# Patient Record
Sex: Female | Born: 1960 | Race: White | Hispanic: No | Marital: Married
Health system: Southern US, Community
[De-identification: ages and names within clinical notes are randomized; demographics above are authoritative.]

---

## 1998-10-16 ENCOUNTER — Other Ambulatory Visit: Admission: RE | Admit: 1998-10-16 | Discharge: 1998-10-16 | Payer: Self-pay | Admitting: Family Medicine

## 2001-02-02 ENCOUNTER — Other Ambulatory Visit: Admission: RE | Admit: 2001-02-02 | Discharge: 2001-02-02 | Payer: Self-pay | Admitting: Family Medicine

## 2002-02-11 ENCOUNTER — Other Ambulatory Visit: Admission: RE | Admit: 2002-02-11 | Discharge: 2002-02-11 | Payer: Self-pay | Admitting: Family Medicine

## 2002-06-13 ENCOUNTER — Ambulatory Visit (HOSPITAL_COMMUNITY): Admission: RE | Admit: 2002-06-13 | Discharge: 2002-06-13 | Payer: Self-pay | Admitting: Family Medicine

## 2002-06-13 ENCOUNTER — Encounter: Payer: Self-pay | Admitting: Family Medicine

## 2003-09-03 ENCOUNTER — Encounter: Admission: RE | Admit: 2003-09-03 | Discharge: 2003-09-03 | Payer: Self-pay | Admitting: Cardiology

## 2004-07-26 ENCOUNTER — Ambulatory Visit: Payer: Self-pay | Admitting: Family Medicine

## 2005-05-09 ENCOUNTER — Ambulatory Visit: Payer: Self-pay | Admitting: Family Medicine

## 2005-06-19 IMAGING — CT CT ANGIO ABDOMEN
2 of 6 series · 14 of 32 positions shown, 19 images · IV contrast (125 ML OMNI 300)
Comparison: none

CLINICAL DATA: 42-year-old female, benign renovascular hypertension.  
CT ANGIOGRAM OF THE ABDOMEN TO EVALUATE FOR RENAL ARTERY STENOSIS
No comparisons.
TECHNIQUE: Pre- and post-contrast imaging was performed of the abdomen without oral contrast. 125 cc of Omnipaque 350 was administered for the post-contrast axial imaging.  Coronal and sagittal reformations were obtained from the thin section data set.

[Series 4: renal/sma cta · axial · 0.70mm/px · z∈[-280,-70]mm · 7 of 278 slices shown, 12 images]
[im 35/278  soft-tissue]
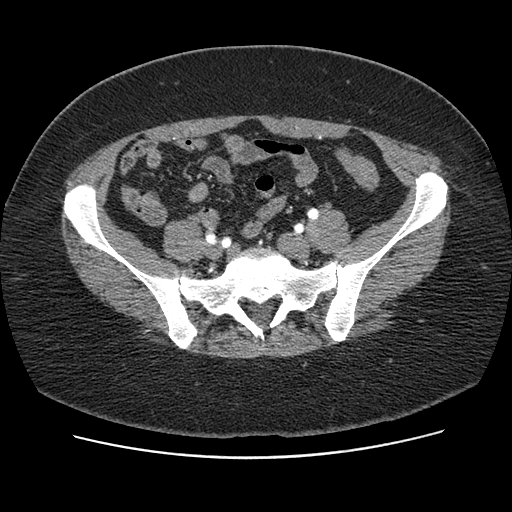
[im 35/278  bone]
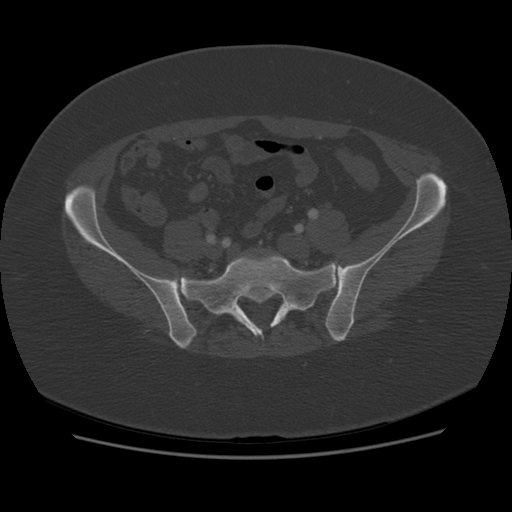
[im 70/278  soft-tissue]
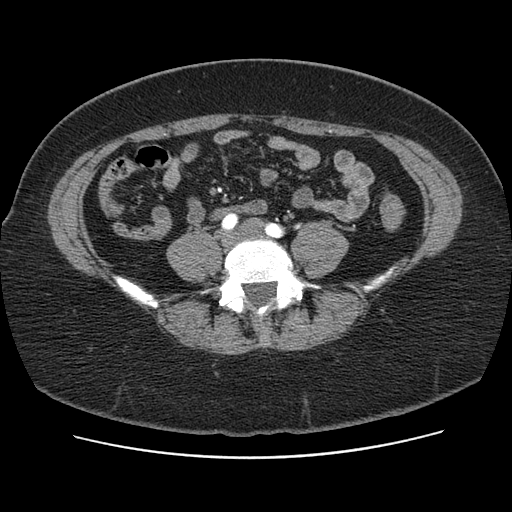
[im 104/278  soft-tissue]
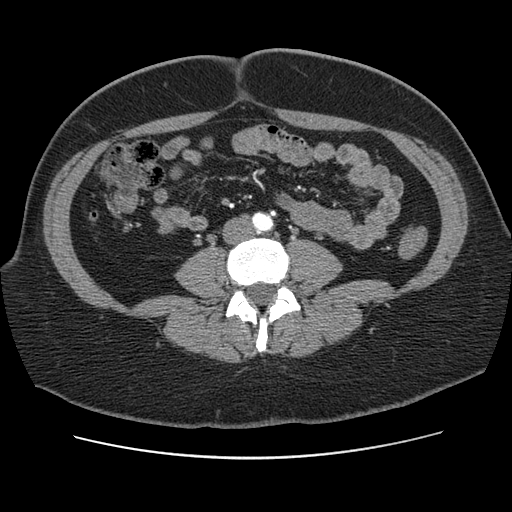
[im 139/278  soft-tissue]
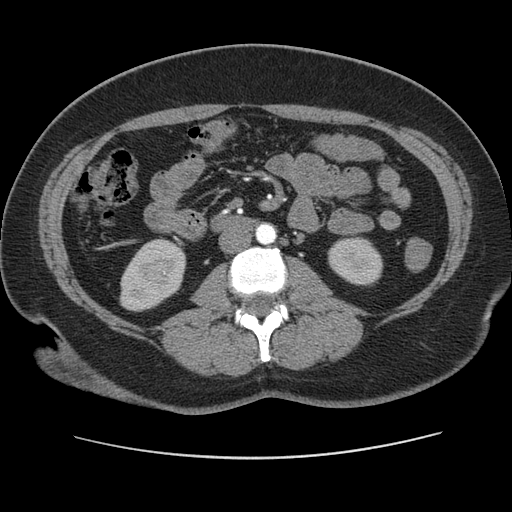
[im 139/278  lung]
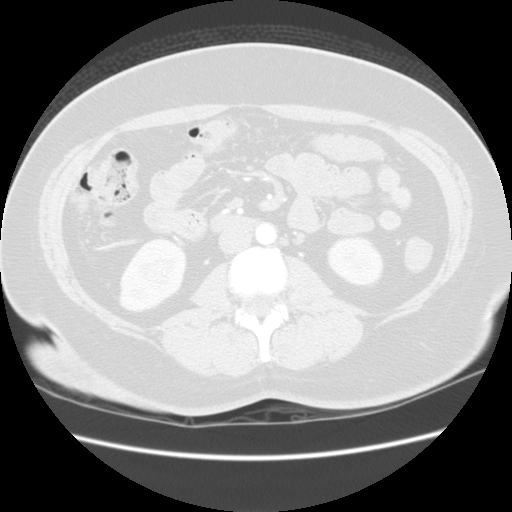
[im 174/278  soft-tissue]
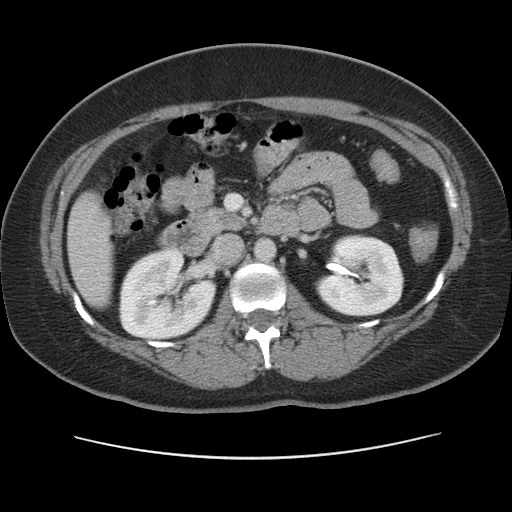
[im 174/278  lung]
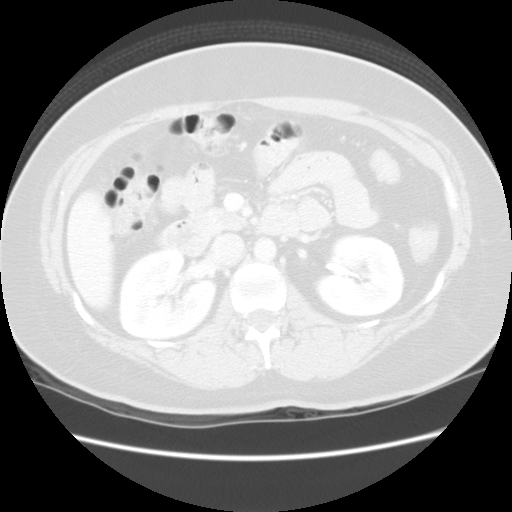
[im 208/278  soft-tissue]
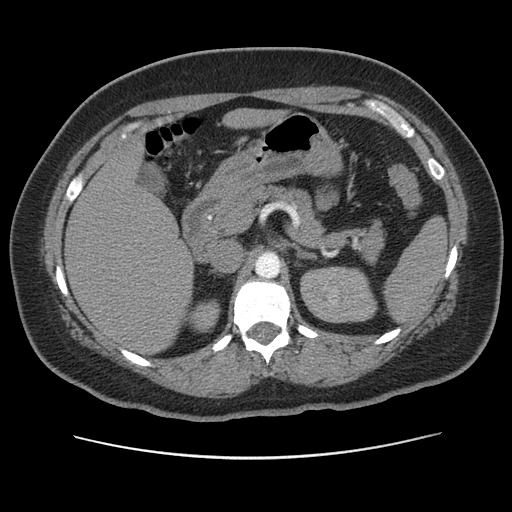
[im 208/278  lung]
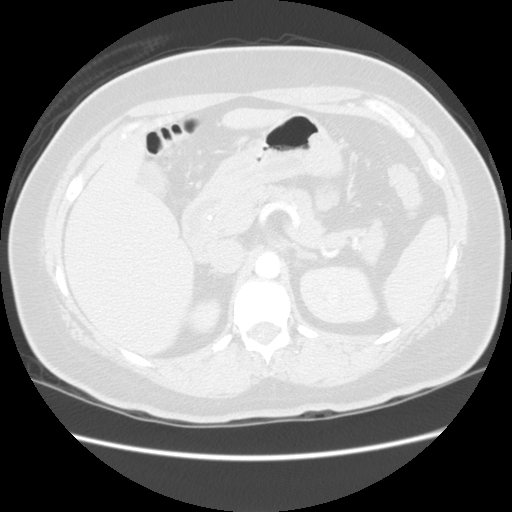
[im 243/278  soft-tissue]
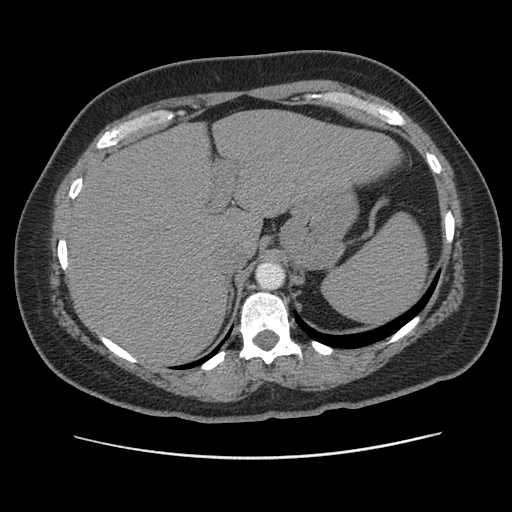
[im 243/278  lung]
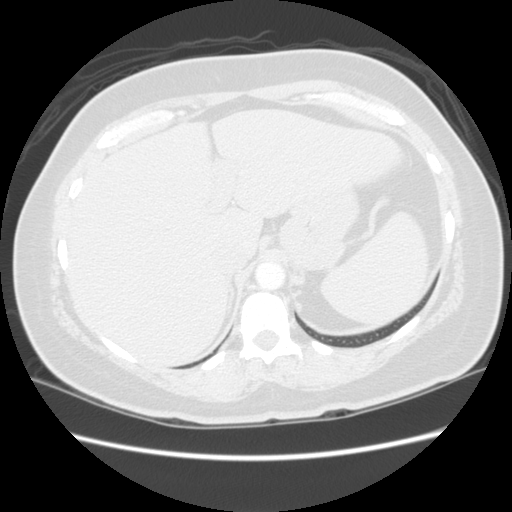

[Series 5: recon 2: renal/sma cta · axial · 0.70mm/px · z∈[-296,-102]mm · 7 of 437 slices shown]
[im 32/437  soft-tissue]
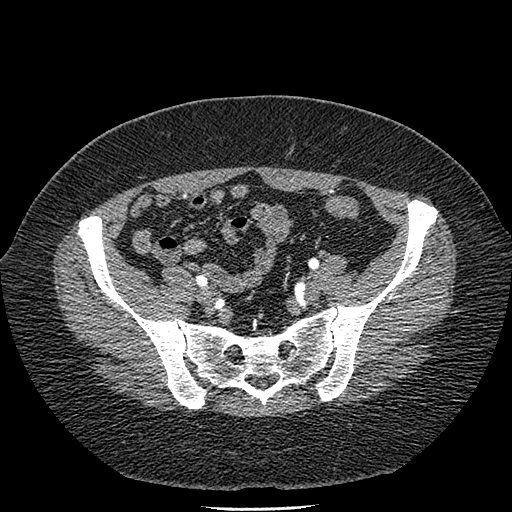
[im 94/437  soft-tissue]
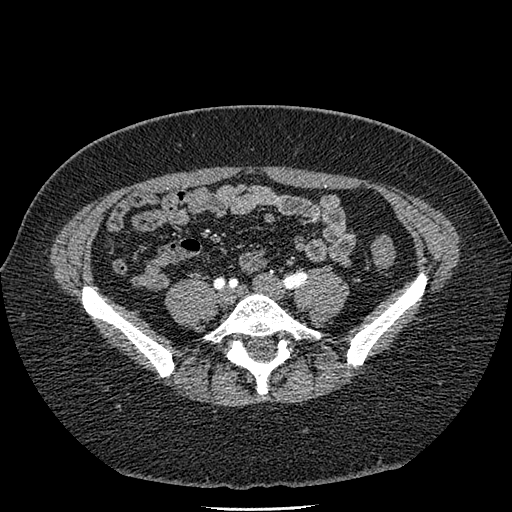
[im 156/437  soft-tissue]
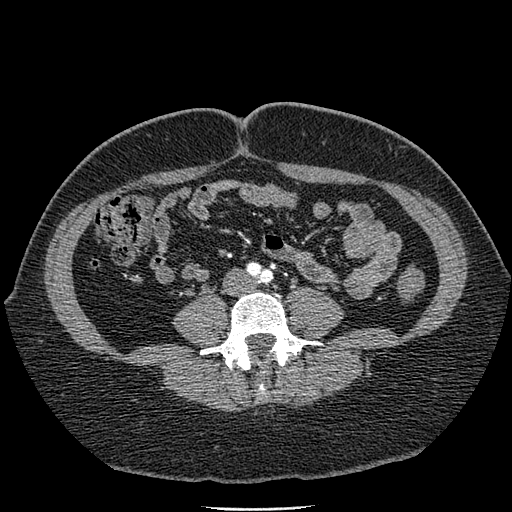
[im 187/437  soft-tissue]
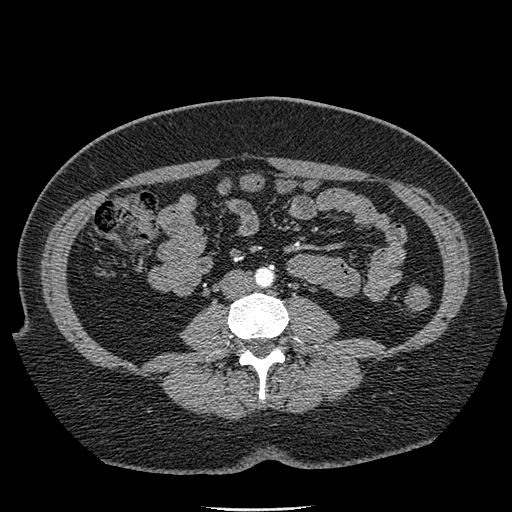
[im 250/437  soft-tissue]
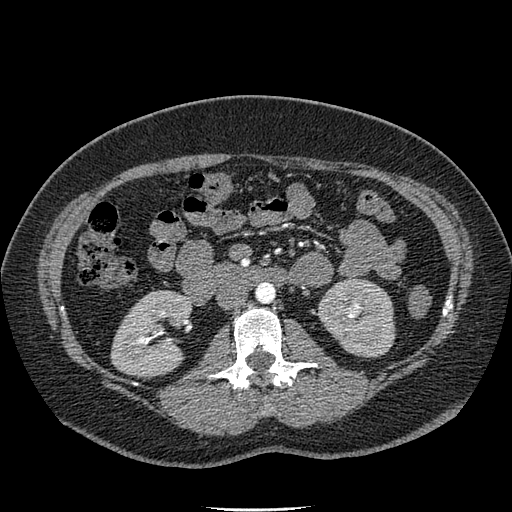
[im 281/437  soft-tissue]
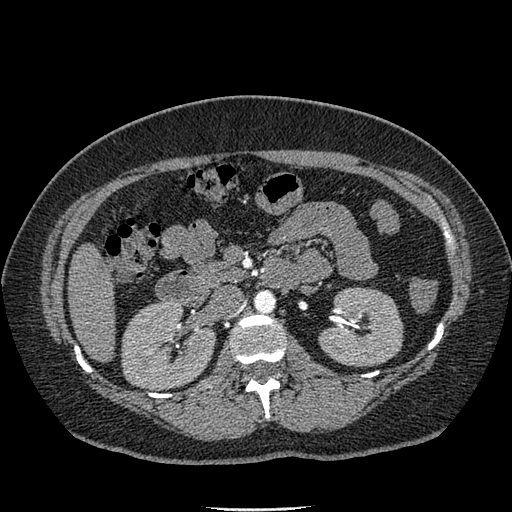
[im 343/437  soft-tissue]
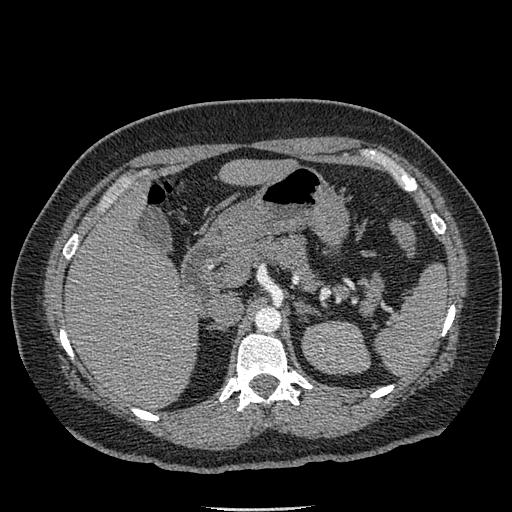

[14 of 32 positions shown; findings below may reference images not displayed]

FINDINGS: Initial noncontrast imaging of the abdomen demonstrates no renal calculi.  There is no hydronephrosis.  Lung bases are clear.  
Post-contrast, the thoracoabdominal aorta visualized is normal in caliber.  The common, internal, and external iliac arteries are all patent.  The celiac, superior mesenteric, and inferior mesenteric arteries are patent.  There are four total renal arteries.  The main renal arteries bilaterally are patent.  Additionally, on the left side, there is an accessory left upper pole renal artery which is patent.  On the right side, there is an accessory mid to lower pole renal artery which is also patent.  Right kidney measures 9.6 cm in long axial and the left kidney 10.1 cm.  No renal obstruction or hydronephrosis.  
Additional imaging of the abdomen was performed during the portal venous phase.  Lung bases are clear.  No pericardial or pleural fluid. Heart size is normal.   Liver, gallbladder, spleen, adrenal glands, biliary system, pancreas, and kidneys are all normal.  Exam of the bowel is limited because of the lack of oral contrast for a CTA protocol.  Appendix in the right lower quadrant is visualized and normal.  No ascites, bowel obstruction, free air, or lymphadenopathy.

  IMPRESSION
No evidence of renal artery stenosis.  Four total renal arteries with accessory renal vasculature bilaterally.  
Patent visceral vasculature.

## 2017-12-26 ENCOUNTER — Encounter: Payer: Self-pay | Admitting: Neurology

## 2018-02-16 NOTE — Progress Notes (Deleted)
NEUROLOGY CONSULTATION NOTE  Katherine Knox MRN: [id number] DOB: ***  Referring provider: *** Primary care provider: ***  Reason for consult:  ***  HISTORY OF PRESENT ILLNESS: ***.  Records and images were personally reviewed where available.  ***.  PAST MEDICAL HISTORY: No past medical history on file.  PAST SURGICAL HISTORY: *** The histories are not reviewed yet. Please review them in the "History" navigator section and refresh this SmartLink.  MEDICATIONS: No current outpatient medications on file prior to visit.   No current facility-administered medications on file prior to visit.     ALLERGIES: Allergies not on file  FAMILY HISTORY: No family history on file. ***.  SOCIAL HISTORY: Social History   Socioeconomic History  . Marital status: Married    Spouse name: Not on file  . Number of children: Not on file  . Years of education: Not on file  . Highest education level: Not on file  Occupational History  . Not on file  Social Needs  . Financial resource strain: Not on file  . Food insecurity:    Worry: Not on file    Inability: Not on file  . Transportation needs:    Medical: Not on file    Non-medical: Not on file  Tobacco Use  . Smoking status: Not on file  Substance and Sexual Activity  . Alcohol use: Not on file  . Drug use: Not on file  . Sexual activity: Not on file  Lifestyle  . Physical activity:    Days per week: Not on file    Minutes per session: Not on file  . Stress: Not on file  Relationships  . Social connections:    Talks on phone: Not on file    Gets together: Not on file    Attends religious service: Not on file    Active member of club or organization: Not on file    Attends meetings of clubs or organizations: Not on file    Relationship status: Not on file  . Intimate partner violence:    Fear of current or ex partner: Not on file    Emotionally abused: Not on file    Physically abused: Not on file    Forced  sexual activity: Not on file  Other Topics Concern  . Not on file  Social History Narrative  . Not on file    REVIEW OF SYSTEMS: Constitutional: No fevers, chills, or sweats, no generalized fatigue, change in appetite Eyes: No visual changes, double vision, eye pain Ear, nose and throat: No hearing loss, ear pain, nasal congestion, sore throat Cardiovascular: No chest pain, palpitations Respiratory:  No shortness of breath at rest or with exertion, wheezes GastrointestinaI: No nausea, vomiting, diarrhea, abdominal pain, fecal incontinence Genitourinary:  No dysuria, urinary retention or frequency Musculoskeletal:  No neck pain, back pain Integumentary: No rash, pruritus, skin lesions Neurological: as above Psychiatric: No depression, insomnia, anxiety Endocrine: No palpitations, fatigue, diaphoresis, mood swings, change in appetite, change in weight, increased thirst Hematologic/Lymphatic:  No purpura, petechiae. Allergic/Immunologic: no itchy/runny eyes, nasal congestion, recent allergic reactions, rashes  PHYSICAL EXAM: *** General: No acute distress.  Patient appears ***-groomed.  *** Head:  Normocephalic/atraumatic Eyes:  fundi examined but not visualized Neck: supple, no paraspinal tenderness, full range of motion Back: No paraspinal tenderness Heart: regular rate and rhythm Lungs: Clear to auscultation bilaterally. Vascular: No carotid bruits. Neurological Exam: Mental status: alert and oriented to person, place, and time, recent and remote memory intact,  fund of knowledge intact, attention and concentration intact, speech fluent and not dysarthric, language intact. Cranial nerves: CN I: not tested CN II: pupils equal, round and reactive to light, visual fields intact CN III, IV, VI:  full range of motion, no nystagmus, no ptosis CN V: facial sensation intact CN VII: upper and lower face symmetric CN VIII: hearing intact CN IX, X: gag intact, uvula midline CN XI:  sternocleidomastoid and trapezius muscles intact CN XII: tongue midline Bulk & Tone: normal, no fasciculations. Motor:  5/5 throughout *** Sensation:  Pinprick *** temperature *** and vibration sensation intact.  ***. Deep Tendon Reflexes:  2+ throughout, *** toes downgoing.  *** Finger to nose testing:  Without dysmetria.  *** Heel to shin:  Without dysmetria.  *** Gait:  Normal station and stride.  Able to turn and tandem walk. Romberg ***.  IMPRESSION: ***  PLAN: ***  Thank you for allowing me to take part in the care of this patient.  Shon Millet, DO  CC: ***

## 2018-02-19 ENCOUNTER — Ambulatory Visit: Payer: Self-pay | Admitting: Neurology

## 2019-10-11 DIAGNOSIS — E1169 Type 2 diabetes mellitus with other specified complication: Secondary | ICD-10-CM | POA: Diagnosis not present

## 2019-10-11 DIAGNOSIS — I160 Hypertensive urgency: Secondary | ICD-10-CM | POA: Diagnosis not present

## 2019-10-12 DIAGNOSIS — E1169 Type 2 diabetes mellitus with other specified complication: Secondary | ICD-10-CM | POA: Diagnosis not present

## 2019-10-12 DIAGNOSIS — I160 Hypertensive urgency: Secondary | ICD-10-CM | POA: Diagnosis not present

## 2019-10-12 DIAGNOSIS — R0602 Shortness of breath: Secondary | ICD-10-CM

## 2019-10-13 DIAGNOSIS — E1169 Type 2 diabetes mellitus with other specified complication: Secondary | ICD-10-CM | POA: Diagnosis not present

## 2019-10-13 DIAGNOSIS — I160 Hypertensive urgency: Secondary | ICD-10-CM | POA: Diagnosis not present

## 2024-06-20 ENCOUNTER — Other Ambulatory Visit (HOSPITAL_COMMUNITY): Payer: Self-pay

## 2024-06-23 ENCOUNTER — Other Ambulatory Visit (HOSPITAL_COMMUNITY): Payer: Self-pay

## 2024-06-23 MED ORDER — DAPAGLIFLOZIN PROPANEDIOL 10 MG PO TABS
10.0000 mg | ORAL_TABLET | Freq: Every morning | ORAL | 2 refills | Status: AC
  Filled 2024-06-23: qty 90, 90d supply, fill #0

## 2024-06-23 MED ORDER — HYDROCHLOROTHIAZIDE 12.5 MG PO TABS
12.5000 mg | ORAL_TABLET | Freq: Every morning | ORAL | 2 refills | Status: AC
  Filled 2024-06-23: qty 90, 90d supply, fill #0

## 2024-06-24 ENCOUNTER — Other Ambulatory Visit (HOSPITAL_COMMUNITY): Payer: Self-pay
